# Patient Record
Sex: Male | Born: 1975 | Race: White | Hispanic: No | Marital: Single
Health system: Southern US, Community
[De-identification: ages and names within clinical notes are randomized; demographics above are authoritative.]

## PROBLEM LIST (undated history)

## (undated) ENCOUNTER — Emergency Department (HOSPITAL_COMMUNITY): Admission: EM | Payer: Self-pay

---

## 2002-05-22 ENCOUNTER — Encounter: Payer: Self-pay | Admitting: Neurosurgery

## 2002-05-22 ENCOUNTER — Ambulatory Visit (HOSPITAL_COMMUNITY): Admission: RE | Admit: 2002-05-22 | Discharge: 2002-05-23 | Payer: Self-pay | Admitting: Neurosurgery

## 2002-07-25 ENCOUNTER — Ambulatory Visit (HOSPITAL_BASED_OUTPATIENT_CLINIC_OR_DEPARTMENT_OTHER): Admission: RE | Admit: 2002-07-25 | Discharge: 2002-07-25 | Payer: Self-pay | Admitting: Orthopedic Surgery

## 2002-07-25 ENCOUNTER — Encounter: Payer: Self-pay | Admitting: Emergency Medicine

## 2002-07-25 ENCOUNTER — Emergency Department (HOSPITAL_COMMUNITY): Admission: EM | Admit: 2002-07-25 | Discharge: 2002-07-25 | Payer: Self-pay | Admitting: Emergency Medicine

## 2002-08-28 ENCOUNTER — Ambulatory Visit (HOSPITAL_BASED_OUTPATIENT_CLINIC_OR_DEPARTMENT_OTHER): Admission: RE | Admit: 2002-08-28 | Discharge: 2002-08-28 | Payer: Self-pay | Admitting: Orthopedic Surgery

## 2005-01-03 ENCOUNTER — Emergency Department (HOSPITAL_COMMUNITY): Admission: EM | Admit: 2005-01-03 | Discharge: 2005-01-03 | Payer: Self-pay | Admitting: Emergency Medicine

## 2006-05-13 IMAGING — CT CT CERVICAL SPINE W/O CM
2 of 10 series · 9 of 28 positions shown, 10 images · IV contrast (agent unspecified)
Comparison: None

CLINICAL DATA: Fall, loss of consciousness

HEAD CT WITHOUT CONTRAST:
TECHNIQUE: 5mm collimated images were obtained from the base of the skull
through the vertex according to standard protocol without contrast.
TECHNIQUE: Multi-detector CT imaging of the cervical spine was performed. 
Sagittal and coronal plane reformatted images were reconstructed from the axial
CT data, and were also reviewed.

[Series 104: cervical spine · axial · 0.29mm/px · z∈[-339,-237]mm · 3 of 329 slices shown, 4 images]
[im 83/329  soft-tissue]
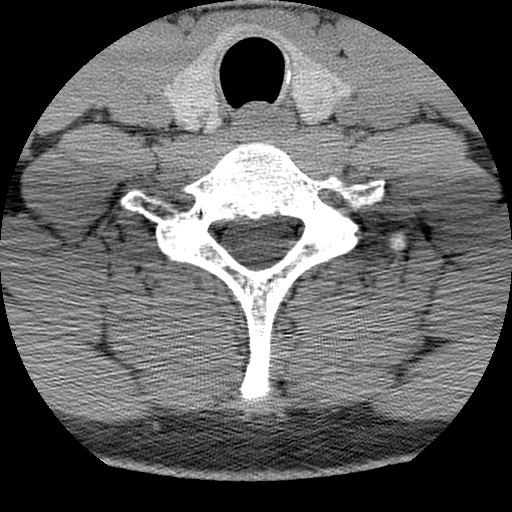
[im 83/329  bone]
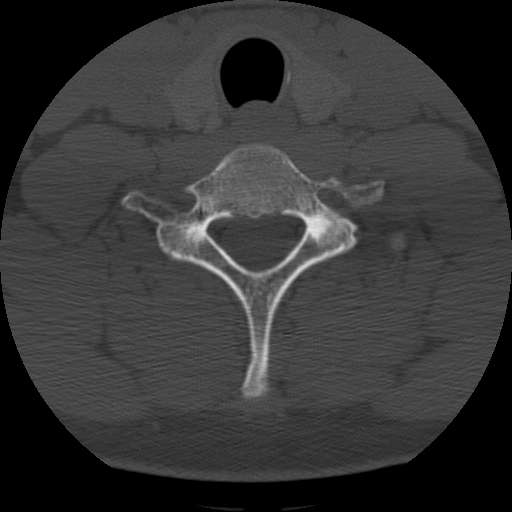
[im 165/329  bone]
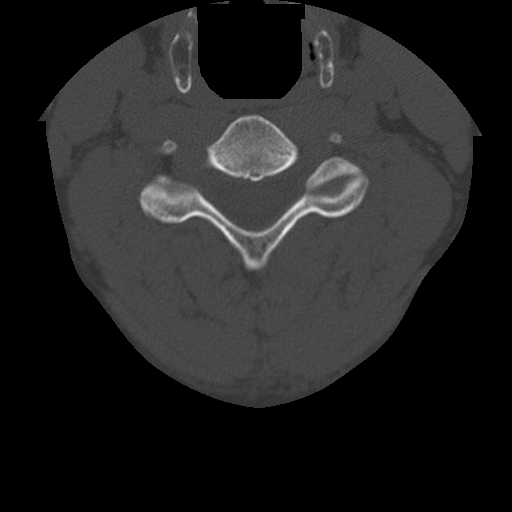
[im 247/329  bone]
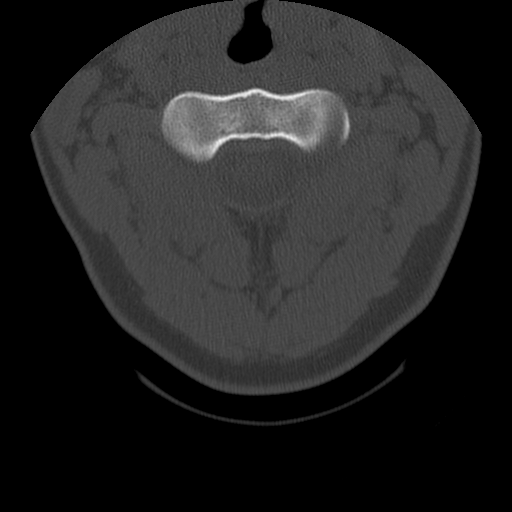

[Series 106: reformatted · coronal · 0.41mm/px · 6 of 59 slices shown]
[im 15/59  soft-tissue]
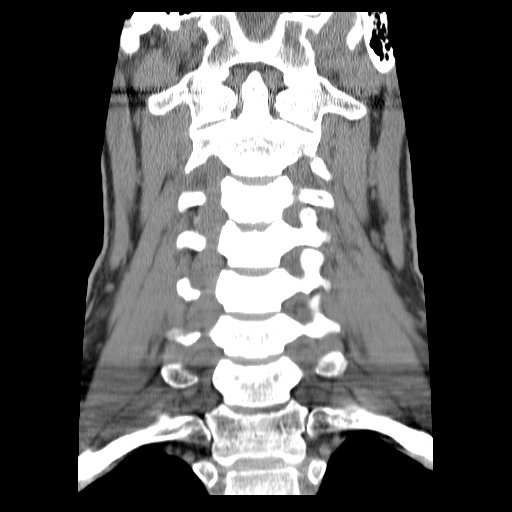
[im 20/59  bone]
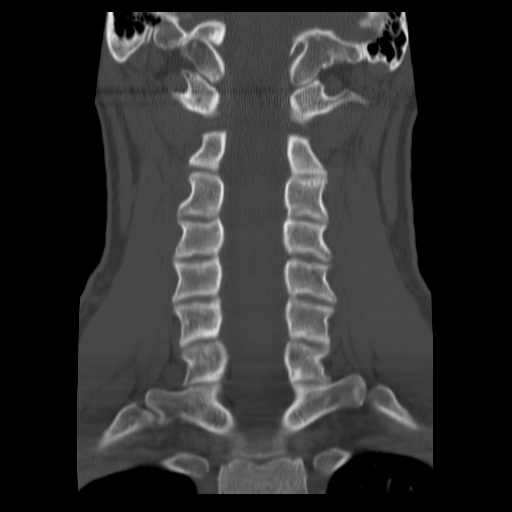
[im 25/59  bone]
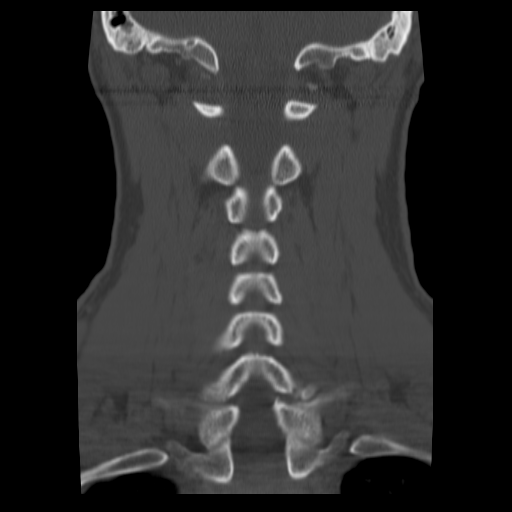
[im 30/59  bone]
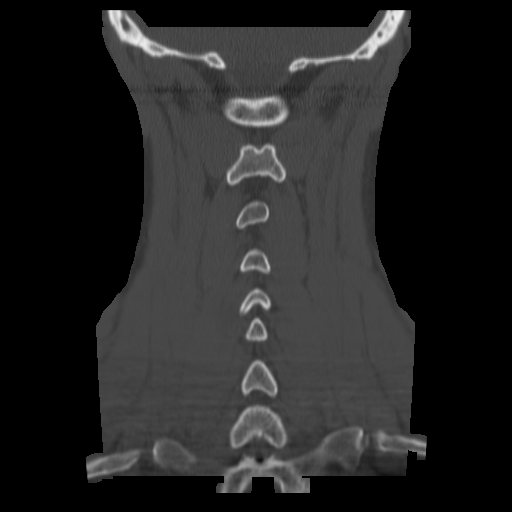
[im 34/59  bone]
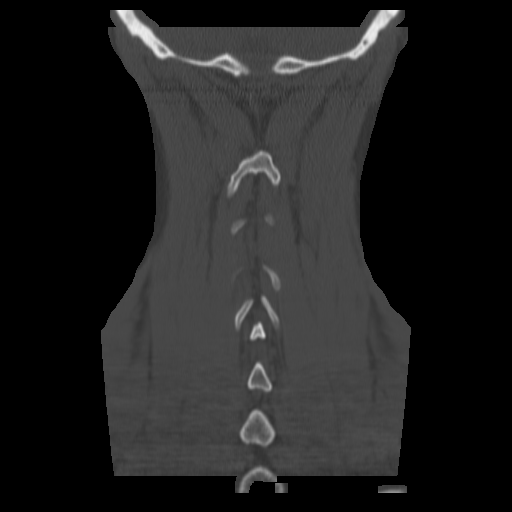
[im 39/59  bone]
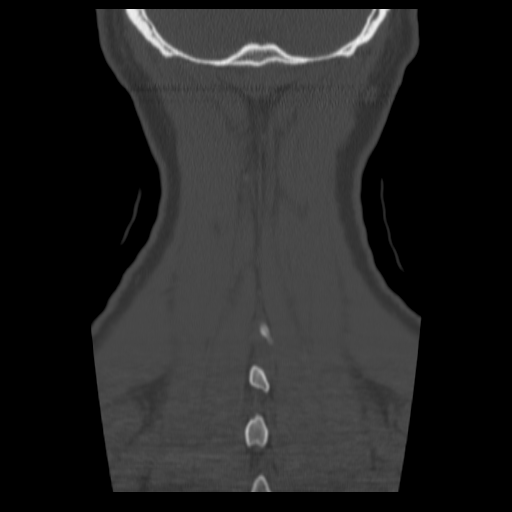

[9 of 28 positions shown; findings below may reference images not displayed]

FINDINGS: There is no evidence of intracranial hemorrhage, brain edema, or mass
effect.  No other intra-axial abnormalities are seen, and the ventricles are
within normal limits.  No abnormal extra-axial fluid collections or masses are
identified.  No skull abnormalities are noted.
IMPRESSION: No acute intracranial abnormality.

CERVICAL SPINE CT WITHOUT CONTRAST:
FINDINGS: There is no evidence of cervical spine fracture.  Spinal alignment is
normal.  No other significant bone abnormalities are identified.
IMPRESSION: No evidence of cervical spine fracture or subluxation.

CT MULTIPLANAR RECONSTRUCTION OF THE CERVICAL SPINE:

Multi-planar reformatted CT images were reconstructed from the axial CT data. 
These images were reviewed, and pertinent findings are included in the complete
cervical spine CT report above.

## 2015-01-02 ENCOUNTER — Encounter: Payer: Self-pay | Admitting: "Endocrinology

## 2015-10-13 NOTE — Telephone Encounter (Signed)
This encounter was created in error - please disregard.
